# Patient Record
Sex: Male | Born: 1960 | Race: Black or African American | Hispanic: No | Marital: Married | Smoking: Never smoker
Health system: Southern US, Community
[De-identification: ages and names within clinical notes are randomized; demographics above are authoritative.]

## PROBLEM LIST (undated history)

## (undated) DIAGNOSIS — E7800 Pure hypercholesterolemia, unspecified: Secondary | ICD-10-CM

## (undated) DIAGNOSIS — I10 Essential (primary) hypertension: Secondary | ICD-10-CM

## (undated) HISTORY — PX: EYE SURGERY: SHX253

## (undated) HISTORY — PX: COLON SURGERY: SHX602

---

## 2008-11-08 ENCOUNTER — Emergency Department: Payer: Self-pay | Admitting: Emergency Medicine

## 2008-11-26 ENCOUNTER — Ambulatory Visit: Payer: Self-pay

## 2009-09-03 ENCOUNTER — Emergency Department: Payer: Self-pay | Admitting: Emergency Medicine

## 2009-09-14 ENCOUNTER — Ambulatory Visit: Payer: Self-pay | Admitting: Internal Medicine

## 2010-06-28 ENCOUNTER — Ambulatory Visit: Payer: Self-pay

## 2010-09-11 ENCOUNTER — Ambulatory Visit: Payer: Self-pay | Admitting: Adult Health

## 2010-09-22 DIAGNOSIS — M109 Gout, unspecified: Secondary | ICD-10-CM | POA: Insufficient documentation

## 2010-12-06 DIAGNOSIS — M1710 Unilateral primary osteoarthritis, unspecified knee: Secondary | ICD-10-CM | POA: Insufficient documentation

## 2010-12-08 ENCOUNTER — Ambulatory Visit: Payer: Self-pay | Admitting: Adult Health

## 2010-12-27 ENCOUNTER — Encounter: Payer: Self-pay | Admitting: "Endocrinology

## 2011-01-12 ENCOUNTER — Encounter: Payer: Self-pay | Admitting: "Endocrinology

## 2011-02-26 DIAGNOSIS — K219 Gastro-esophageal reflux disease without esophagitis: Secondary | ICD-10-CM | POA: Insufficient documentation

## 2011-04-25 DIAGNOSIS — K449 Diaphragmatic hernia without obstruction or gangrene: Secondary | ICD-10-CM | POA: Insufficient documentation

## 2011-06-28 DIAGNOSIS — R1314 Dysphagia, pharyngoesophageal phase: Secondary | ICD-10-CM | POA: Insufficient documentation

## 2012-03-17 ENCOUNTER — Ambulatory Visit: Payer: Self-pay

## 2012-03-17 ENCOUNTER — Ambulatory Visit: Payer: Self-pay | Admitting: Adult Health

## 2012-06-18 DIAGNOSIS — H2510 Age-related nuclear cataract, unspecified eye: Secondary | ICD-10-CM | POA: Insufficient documentation

## 2012-06-18 DIAGNOSIS — H409 Unspecified glaucoma: Secondary | ICD-10-CM | POA: Insufficient documentation

## 2012-09-01 ENCOUNTER — Ambulatory Visit: Payer: Self-pay

## 2013-01-15 ENCOUNTER — Emergency Department: Payer: Self-pay | Admitting: Emergency Medicine

## 2013-01-30 ENCOUNTER — Ambulatory Visit: Payer: Self-pay

## 2013-07-24 DIAGNOSIS — A530 Latent syphilis, unspecified as early or late: Secondary | ICD-10-CM | POA: Insufficient documentation

## 2013-08-06 IMAGING — CR DG CHEST 2V
1 series · 2 of 2 positions shown · non-contrast
Comparison: none

REASON FOR EXAM: FU on pneumonia
COMMENTS:

[Series 1: pa · 0.17mm/px · 2 of 2 slices shown]
[im 1/2]
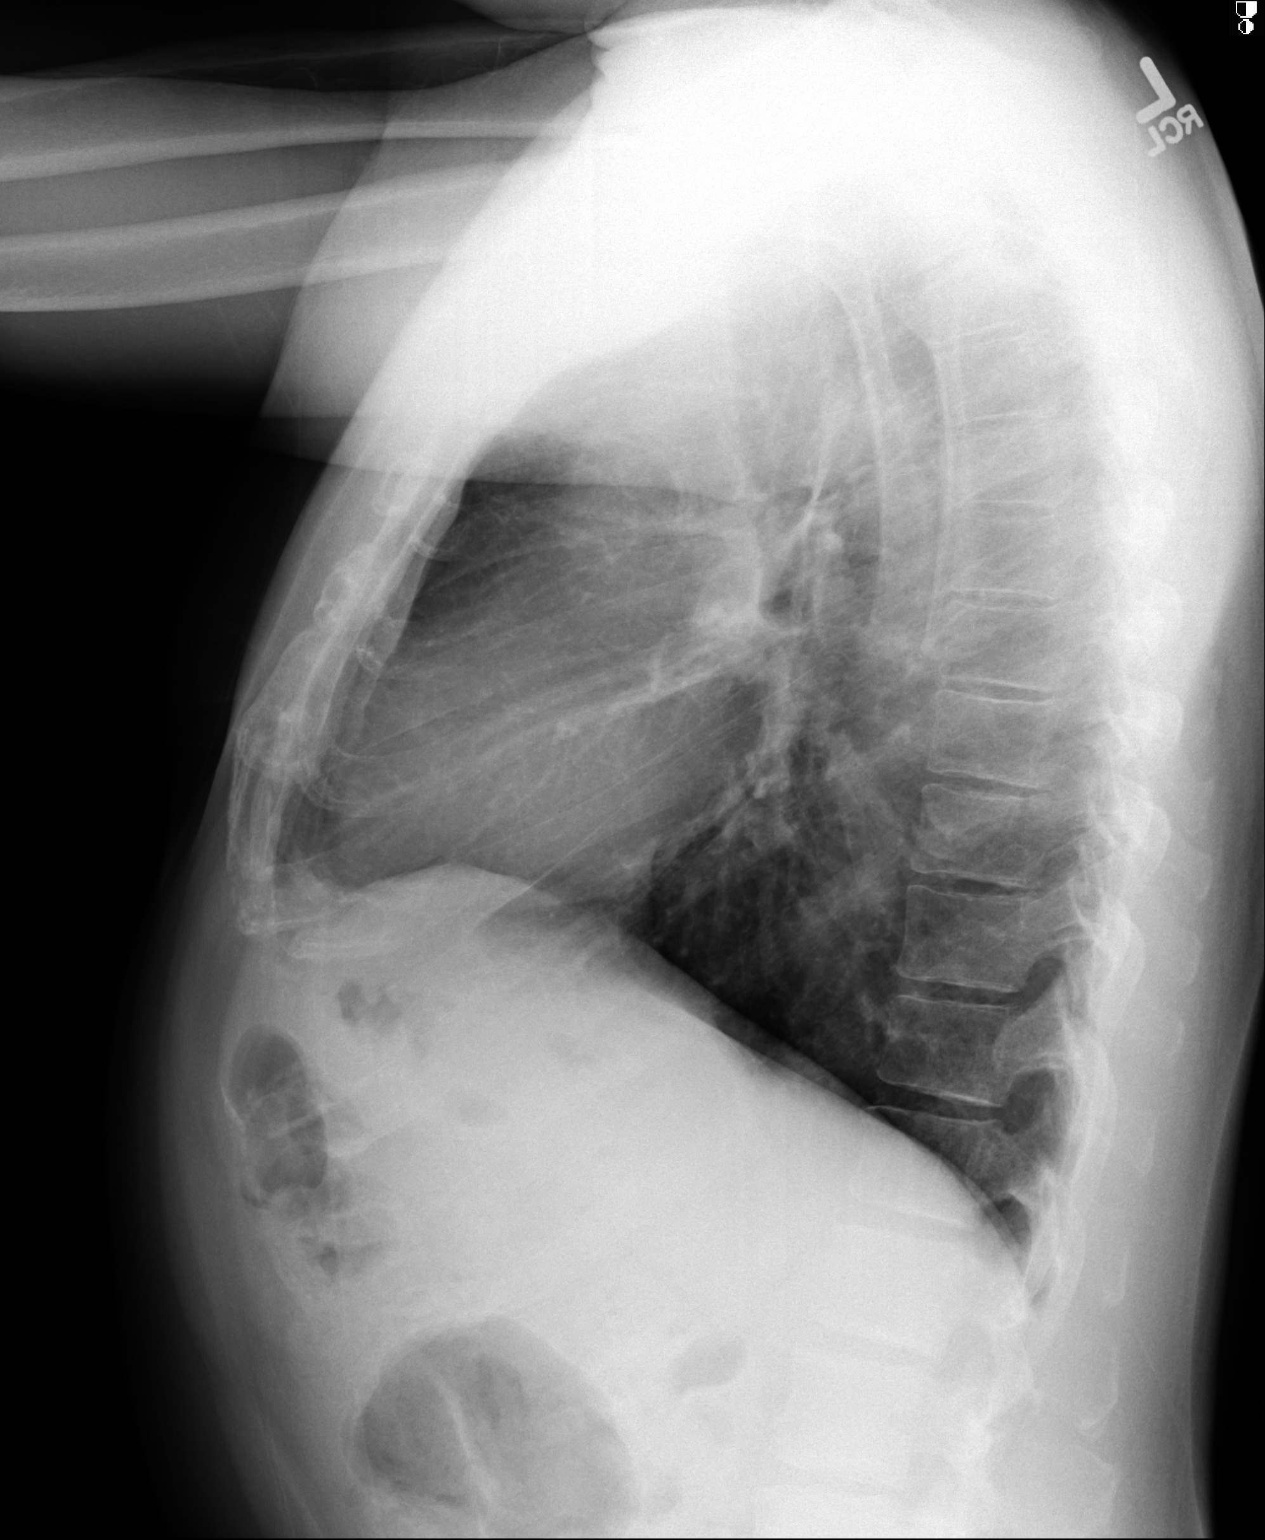
[im 2/2]
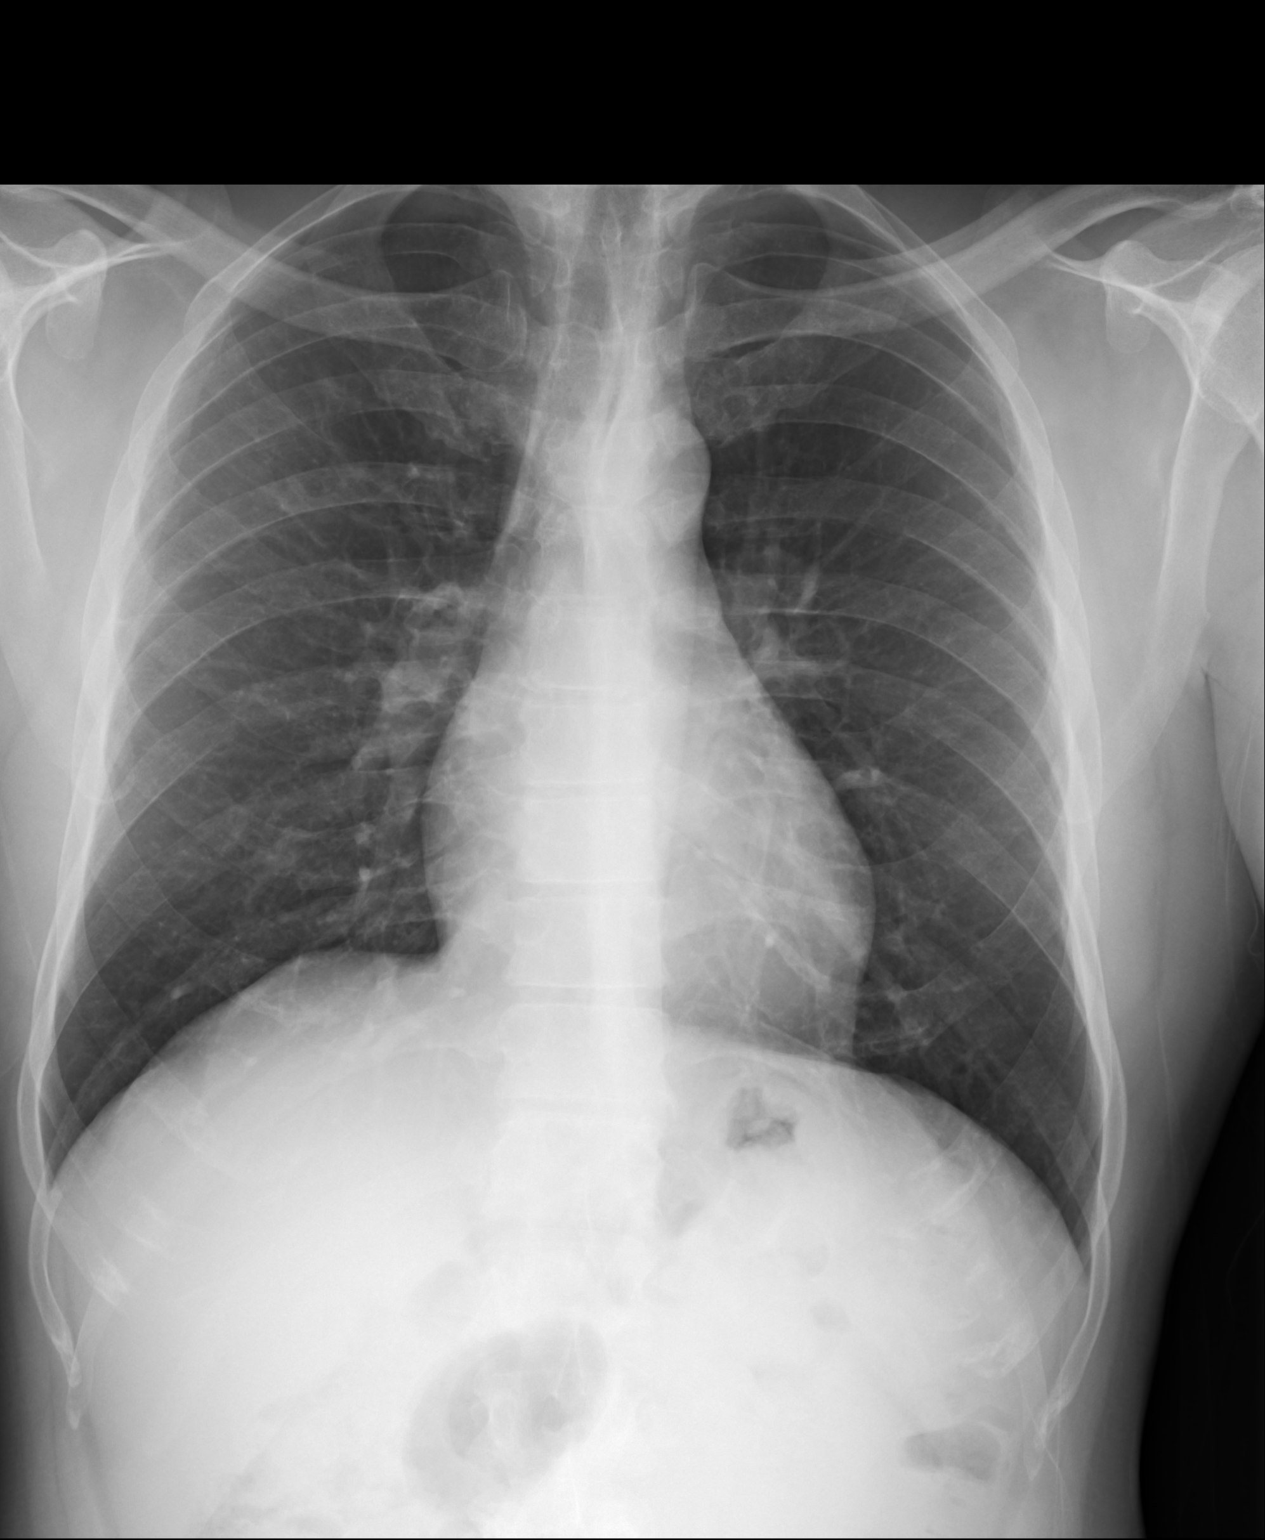

[2 of 2 positions shown; findings below may reference images not displayed]

PROCEDURE:     DXR - DXR CHEST PA (OR AP) AND LATERAL  - January 30, 2013  [DATE]

RESULT:     Comparison is made to the previous study of 01/15/2013.

There is improving aeration in the right upper lobe. The lungs are otherwise
clear. The heart and pulmonary vessels are normal. The bony and mediastinal
structures are unremarkable.
IMPRESSION: Findings consistent with near-complete resolution of
previous right upper lobe pneumonia.

[REDACTED]

## 2014-02-04 ENCOUNTER — Emergency Department: Payer: Self-pay | Admitting: Emergency Medicine

## 2016-02-09 DIAGNOSIS — Z8673 Personal history of transient ischemic attack (TIA), and cerebral infarction without residual deficits: Secondary | ICD-10-CM | POA: Insufficient documentation

## 2016-06-07 ENCOUNTER — Emergency Department
Admission: EM | Admit: 2016-06-07 | Discharge: 2016-06-07 | Disposition: A | Discharge status: Home / Self Care | Payer: Medicare HMO | Attending: Emergency Medicine | Admitting: Emergency Medicine

## 2016-06-07 ENCOUNTER — Encounter: Payer: Self-pay | Admitting: Emergency Medicine

## 2016-06-07 DIAGNOSIS — Y9389 Activity, other specified: Secondary | ICD-10-CM | POA: Diagnosis not present

## 2016-06-07 DIAGNOSIS — Y929 Unspecified place or not applicable: Secondary | ICD-10-CM | POA: Insufficient documentation

## 2016-06-07 DIAGNOSIS — Y999 Unspecified external cause status: Secondary | ICD-10-CM | POA: Insufficient documentation

## 2016-06-07 DIAGNOSIS — M546 Pain in thoracic spine: Secondary | ICD-10-CM | POA: Insufficient documentation

## 2016-06-07 DIAGNOSIS — M62830 Muscle spasm of back: Secondary | ICD-10-CM | POA: Diagnosis not present

## 2016-06-07 MED ORDER — CYCLOBENZAPRINE HCL 10 MG PO TABS
5.0000 mg | ORAL_TABLET | Freq: Once | ORAL | Status: AC
  Administered 2016-06-07: 5 mg via ORAL
  Filled 2016-06-07: qty 2

## 2016-06-07 MED ORDER — KETOROLAC TROMETHAMINE 30 MG/ML IJ SOLN
60.0000 mg | Freq: Once | INTRAMUSCULAR | Status: AC
  Administered 2016-06-07: 60 mg via INTRAMUSCULAR
  Filled 2016-06-07: qty 2

## 2016-06-07 MED ORDER — IBUPROFEN 800 MG PO TABS: 800.0000 mg | ORAL_TABLET | Freq: Three times a day (TID) | ORAL | 0 refills | Status: DC | PRN

## 2016-06-07 MED ORDER — OXYCODONE-ACETAMINOPHEN 5-325 MG PO TABS
1.0000 | ORAL_TABLET | Freq: Once | ORAL | Status: AC
  Administered 2016-06-07: 1 via ORAL
  Filled 2016-06-07: qty 1

## 2016-06-07 MED ORDER — OXYCODONE-ACETAMINOPHEN 5-325 MG PO TABS: 1.0000 | ORAL_TABLET | ORAL | 0 refills | Status: AC | PRN

## 2016-06-07 MED ORDER — CYCLOBENZAPRINE HCL 5 MG PO TABS: ORAL_TABLET | ORAL | 0 refills | Status: DC
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
## 2016-06-07 NOTE — Discharge Instructions (Signed)
1. Take medicines as needed for pain and muscle spasms (Motrin/Percocet/Flexeril). 2. Apply moist heat to affected area several times daily. 3. Return to the ER for worsening symptoms, persistent vomiting, difficulty breathing or other concerns.

## 2016-09-03 DIAGNOSIS — R7303 Prediabetes: Secondary | ICD-10-CM | POA: Insufficient documentation

## 2016-11-05 ENCOUNTER — Emergency Department: Payer: Medicare HMO

## 2016-11-05 ENCOUNTER — Emergency Department
Admission: EM | Admit: 2016-11-05 | Discharge: 2016-11-05 | Disposition: A | Discharge status: Home / Self Care | Payer: Medicare HMO | Attending: Emergency Medicine | Admitting: Emergency Medicine

## 2016-11-05 DIAGNOSIS — R05 Cough: Secondary | ICD-10-CM | POA: Diagnosis present

## 2016-11-05 DIAGNOSIS — J189 Pneumonia, unspecified organism: Secondary | ICD-10-CM | POA: Insufficient documentation

## 2016-11-05 LAB — CBC
HCT: 39.9 % — ABNORMAL LOW (ref 40.0–52.0)
Hemoglobin: 13.8 g/dL (ref 13.0–18.0)
MCH: 30.1 pg (ref 26.0–34.0)
MCHC: 34.6 g/dL (ref 32.0–36.0)
MCV: 87.1 fL (ref 80.0–100.0)
PLATELETS: 249 10*3/uL (ref 150–440)
RBC: 4.59 MIL/uL (ref 4.40–5.90)
RDW: 14.3 % (ref 11.5–14.5)
WBC: 6.6 10*3/uL (ref 3.8–10.6)

## 2016-11-05 LAB — BASIC METABOLIC PANEL
ANION GAP: 8 (ref 5–15)
BUN: 6 mg/dL (ref 6–20)
CALCIUM: 9.1 mg/dL (ref 8.9–10.3)
CO2: 28 mmol/L (ref 22–32)
CREATININE: 1.22 mg/dL (ref 0.61–1.24)
Chloride: 104 mmol/L (ref 101–111)
GLUCOSE: 108 mg/dL — AB (ref 65–99)
Potassium: 3.9 mmol/L (ref 3.5–5.1)
Sodium: 140 mmol/L (ref 135–145)

## 2016-11-05 LAB — TROPONIN I: Troponin I: <0.03 ng/mL (ref <0.03)

## 2016-11-05 MED ORDER — ACETAMINOPHEN 500 MG PO TABS
ORAL_TABLET | ORAL | Status: AC
  Administered 2016-11-05: 1000 mg via ORAL
  Filled 2016-11-05: qty 2

## 2016-11-05 MED ORDER — AZITHROMYCIN 250 MG PO TABS
500.0000 mg | ORAL_TABLET | Freq: Once | ORAL | Status: AC
  Administered 2016-11-05: 500 mg via ORAL
  Filled 2016-11-05: qty 2

## 2016-11-05 MED ORDER — ACETAMINOPHEN 500 MG PO TABS
1000.0000 mg | ORAL_TABLET | Freq: Once | ORAL | Status: AC
  Administered 2016-11-05: 1000 mg via ORAL

## 2016-11-05 MED ORDER — AZITHROMYCIN 250 MG PO TABS: ORAL_TABLET | ORAL | 0 refills | Status: AC

## 2016-11-05 NOTE — ED Triage Notes (Signed)
Pt reports had GI bug last week but that has resolved. Pt reports has had a non-productive cough for four days. Pt reports coughing so much that his throat and chest are burning. Pt reports fever at home up to 104.

## 2016-11-05 NOTE — ED Provider Notes (Signed)
[facility] Emergency Department Provider Note  ____________________________________________   First MD Initiated Contact with Patient 11/05/16 2034     (approximate)  I have reviewed the triage vital signs and the nursing notes.   HISTORY  Chief Complaint Cough   HPI Andrew Bolton is a 56 y.o. male who is presenting with a nonproductive cough over the past 4 days. He says that he is coughing so much now that his chest in his throat or "burning." He reports fever at home up to 104. Denies any nausea vomiting or diarrhea. Says that his chest feels mildly sore at this time and the pain is localized to the center of the sternum. There is no radiation of the pain. Patient denies any shortness of breath. Pain is mild at this time and worsens with coughing.   No past medical history on file.  There are no active problems to display for this patient.   Past Surgical History:  Procedure Laterality Date  . COLON SURGERY    . EYE SURGERY      Prior to Admission medications   Medication Sig Start Date End Date Taking? Authorizing Provider  cyclobenzaprine (FLEXERIL) 5 MG tablet 1 tablet every 8 hours as needed for muscle spasms 06/07/16   Irean Hong, MD  ibuprofen (ADVIL,MOTRIN) 800 MG tablet Take 1 tablet (800 mg total) by mouth every 8 (eight) hours as needed for moderate pain. 06/07/16   Irean Hong, MD  oxyCODONE-acetaminophen (ROXICET) 5-325 MG tablet Take 1 tablet by mouth every 4 (four) hours as needed for severe pain. 06/07/16   Irean Hong, MD    Allergies Patient has no known allergies.  No family history on file.  Social History Social History  Substance Use Topics  . Smoking status: Never Smoker  . Smokeless tobacco: Never Used  . Alcohol use No    Review of Systems Constitutional: as above Eyes: No visual changes. ENT: No sore throat. Cardiovascular: as above Respiratory: as above Gastrointestinal: No abdominal pain.  No nausea,  no vomiting.  No diarrhea.  No constipation. Genitourinary: Negative for dysuria. Musculoskeletal: Negative for back pain. Skin: Negative for rash. Neurological: Negative for headaches, focal weakness or numbness.  10-point ROS otherwise negative.  ____________________________________________   PHYSICAL EXAM:  VITAL SIGNS: ED Triage Vitals  Enc Vitals Group     BP 11/05/16 1722 131/88     Pulse Rate 11/05/16 1722 85     Resp 11/05/16 1722 20     Temp 11/05/16 1722 99 F (37.2 C)     Temp Source 11/05/16 1722 Oral     SpO2 11/05/16 1722 99 %     Weight 11/05/16 1723 158 lb (71.7 kg)     Height 11/05/16 1723 5\' 6"  (1.676 m)     Head Circumference --      Peak Flow --      Pain Score 11/05/16 1739 9     Pain Loc --      Pain Edu? --      Excl. in GC? --     Constitutional: Alert and oriented. Well appearing and in no acute distress. Eyes: Conjunctivae are normal. PERRL. EOMI. Head: Atraumatic. Nose: No congestion/rhinnorhea. Mouth/Throat: Mucous membranes are moist.  Oropharynx non-erythematous. Neck: No stridor.   Cardiovascular: Normal rate, regular rhythm. Grossly normal heart sounds.   Respiratory: Normal respiratory effort.  No retractions. Lungs CTAB. Gastrointestinal: Soft and nontender. No distention.  Musculoskeletal: No lower extremity tenderness nor  edema.  No joint effusions. Neurologic:  Normal speech and language. No gross focal neurologic deficits are appreciated. No gait instability. Skin:  Skin is warm, dry and intact. No rash noted. Psychiatric: Mood and affect are normal. Speech and behavior are normal.  ____________________________________________   LABS (all labs ordered are listed, but only abnormal results are displayed)  Labs Reviewed  CBC - Abnormal; Notable for the following:       Result Value   HCT 39.9 (*)    All other components within normal limits  BASIC METABOLIC PANEL - Abnormal; Notable for the following:    Glucose, Bld 108  (*)    All other components within normal limits  TROPONIN I   ____________________________________________  EKG  ED ECG REPORT I, Arelia LongestSchaevitz,  David M, the attending physician, personally viewed and interpreted this ECG.   Date: 11/05/2016  EKG Time: 1745  Rate: 74  Rhythm: normal sinus rhythm  Axis: Normal  Intervals:none  ST&T Change: No ST segment elevation or depression. No abnormal T-wave inversion.  ____________________________________________  RADIOLOGY  CT Chest Wo Contrast (Final result)  Result time 11/05/16 21:36:10  Final result by Myles RosenthalJohn Stahl, MD (11/05/16 21:36:10)           Narrative:   CLINICAL DATA: Nonproductive cough and chest pain for 4 days. Left lung nodular opacity on chest radiograph.  EXAM: CT CHEST WITHOUT CONTRAST  TECHNIQUE: Multidetector CT imaging of the chest was performed following the standard protocol without IV contrast.  COMPARISON: Chest radiograph on 11/05/2016  FINDINGS: Cardiovascular: No acute findings.  Mediastinum/Nodes: No masses or pathologically enlarged lymph nodes identified on this unenhanced exam.  Lungs/Pleura: Ill-defined airspace opacity is seen within the lingula, which corresponds with the nodular misty seen on recent chest radiograph. This is most consistent with pneumonia. No new discrete pulmonary nodule or mass identified. No evidence pleural effusion. Central tracheobronchial airways are patent.  Upper Abdomen: Unremarkable.  Musculoskeletal: No suspicious bone lesions.  IMPRESSION: Ill-defined airspace opacity in the lingula, most consistent with pneumonia. No evidence pulmonary mass or lymphadenopathy. Followup PA and lateral chest X-ray is recommended in 3-4 weeks following trial of antibiotic therapy to ensure resolution.   Electronically Signed By: Myles RosenthalJohn Stahl M.D. On: 11/05/2016 21:36            DG Chest 2 View (Final result)  Result time 11/05/16 18:25:42  Final  result by Myles RosenthalJohn Stahl, MD (11/05/16 18:25:42)           Narrative:   CLINICAL DATA: Nonproductive cough, chest pain, and fever 4 days.  EXAM: CHEST 2 VIEW  COMPARISON: 01/30/2013  FINDINGS: The heart size and mediastinal contours are within normal limits. No evidence of pulmonary consolidation or pleural effusion. A focal ill-defined nodular opacity is seen in the left midlung which is new since previous study.  IMPRESSION: Focal ill-defined nodular opacity in left midlung. Recommend chest CT without contrast to exclude pulmonary neoplasm.   Electronically Signed By: Myles RosenthalJohn Stahl M.D. On: 11/05/2016 18:25            ____________________________________________   PROCEDURES  Procedure(s) performed:   Procedures  Critical Care performed:   ____________________________________________   INITIAL IMPRESSION / ASSESSMENT AND PLAN / ED COURSE  Pertinent labs & imaging results that were available during my care of the patient were reviewed by me and considered in my medical decision making (see chart for details).  ----------------------------------------- 9:50 PM on 11/05/2016 -----------------------------------------  Patient resting comfortably at this time. No mass found  on the CAT scan as originally thought on the chest x-ray. Likely lingular pneumonia. Patient to be given a Z-Pak. Will follow-up with his primary care doctor in [facility] for repeat chest x-ray and 3-4 weeks. We discussed the diagnosis as well as the plan and he as well as his daughter understanding of and willing to comply.      ____________________________________________   FINAL CLINICAL IMPRESSION(S) / ED DIAGNOSES   Community acquired pneumonia.   NEW MEDICATIONS STARTED DURING THIS VISIT:  New Prescriptions   No medications on file     Note:  This document was prepared using Dragon voice recognition software and may include unintentional dictation errors.      Myrna Blazer, MD 11/05/16 2151

## 2016-11-05 NOTE — ED Notes (Signed)
Gave pt ice chips 

## 2017-01-02 DIAGNOSIS — H402233 Chronic angle-closure glaucoma, bilateral, severe stage: Secondary | ICD-10-CM | POA: Insufficient documentation

## 2017-05-12 IMAGING — CR DG CHEST 2V
1 series · 2 of 2 positions shown · non-contrast
Comparison: 01/30/2013

CLINICAL DATA: Nonproductive cough, chest pain, and fever 4 days.

EXAM:
CHEST  2 VIEW

[Series 1: dg chest 2 view · 0.14mm/px · 2 of 2 slices shown]
[im 1/2]
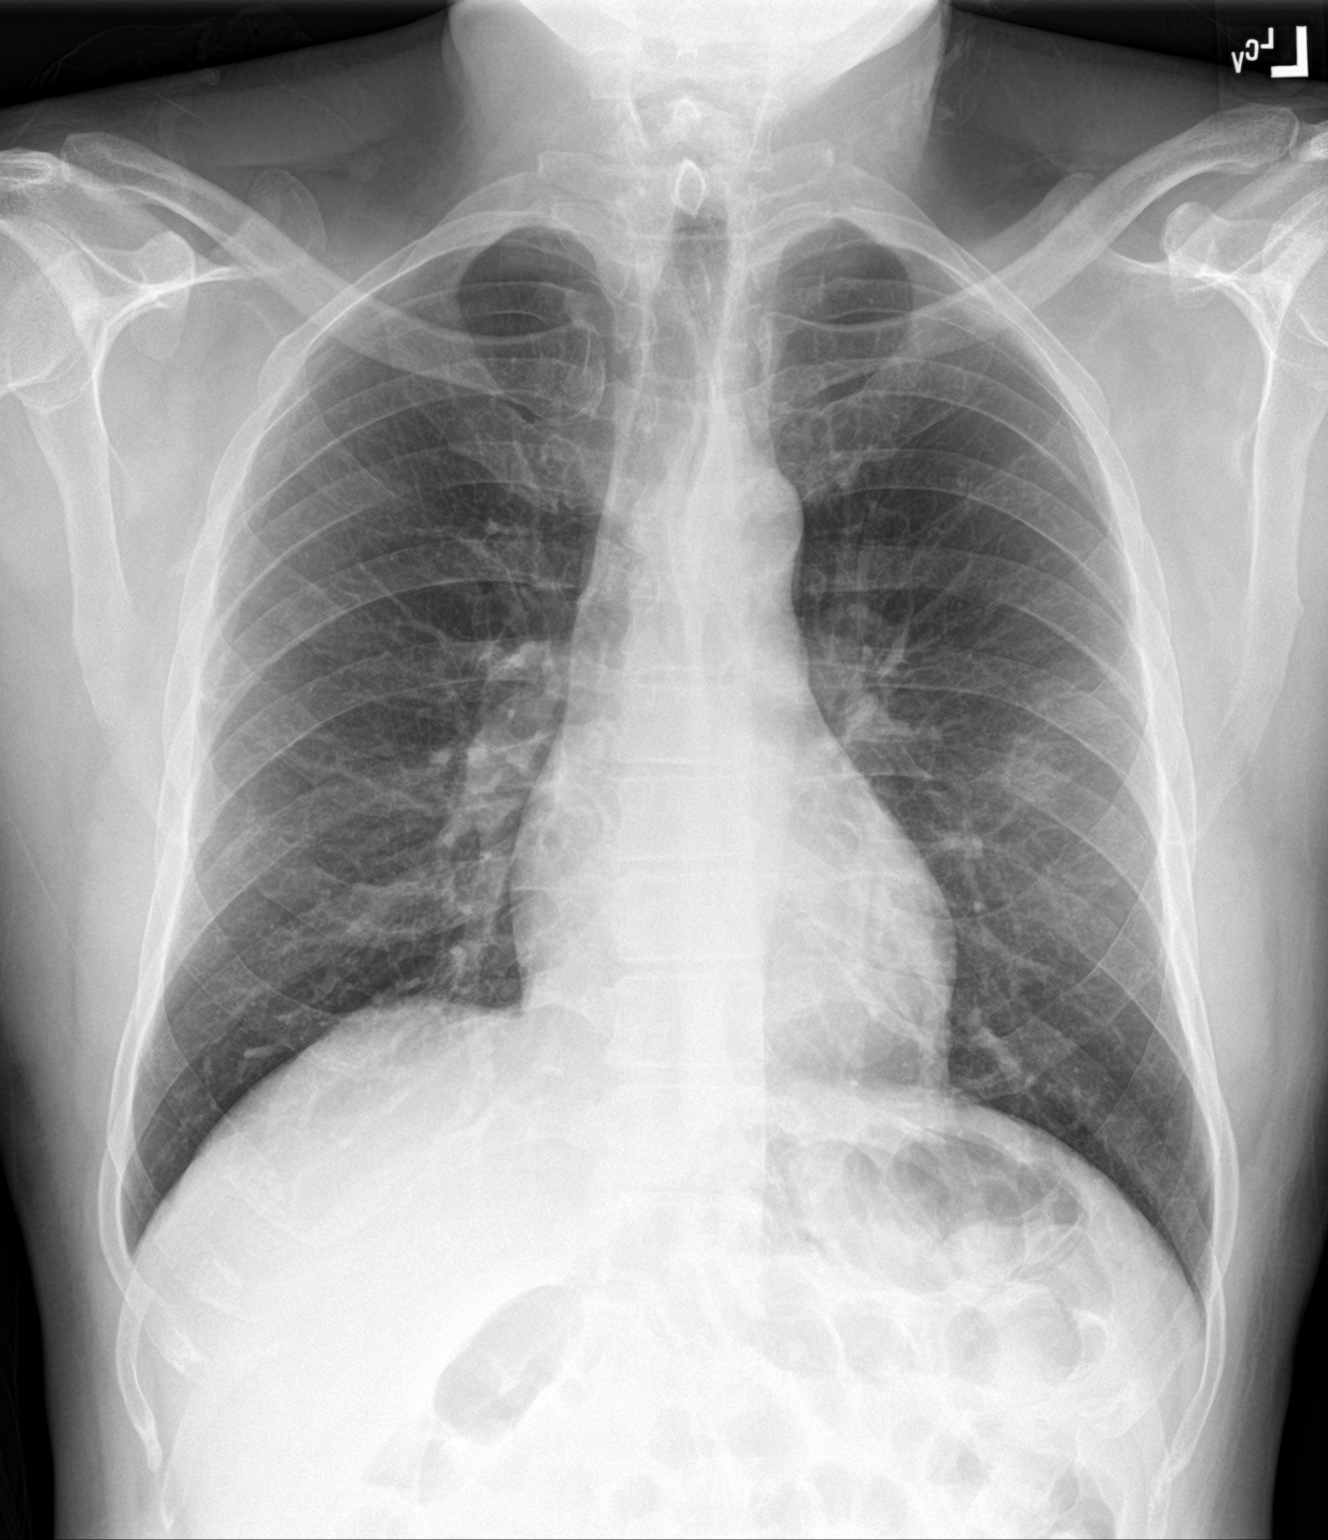
[im 2/2]
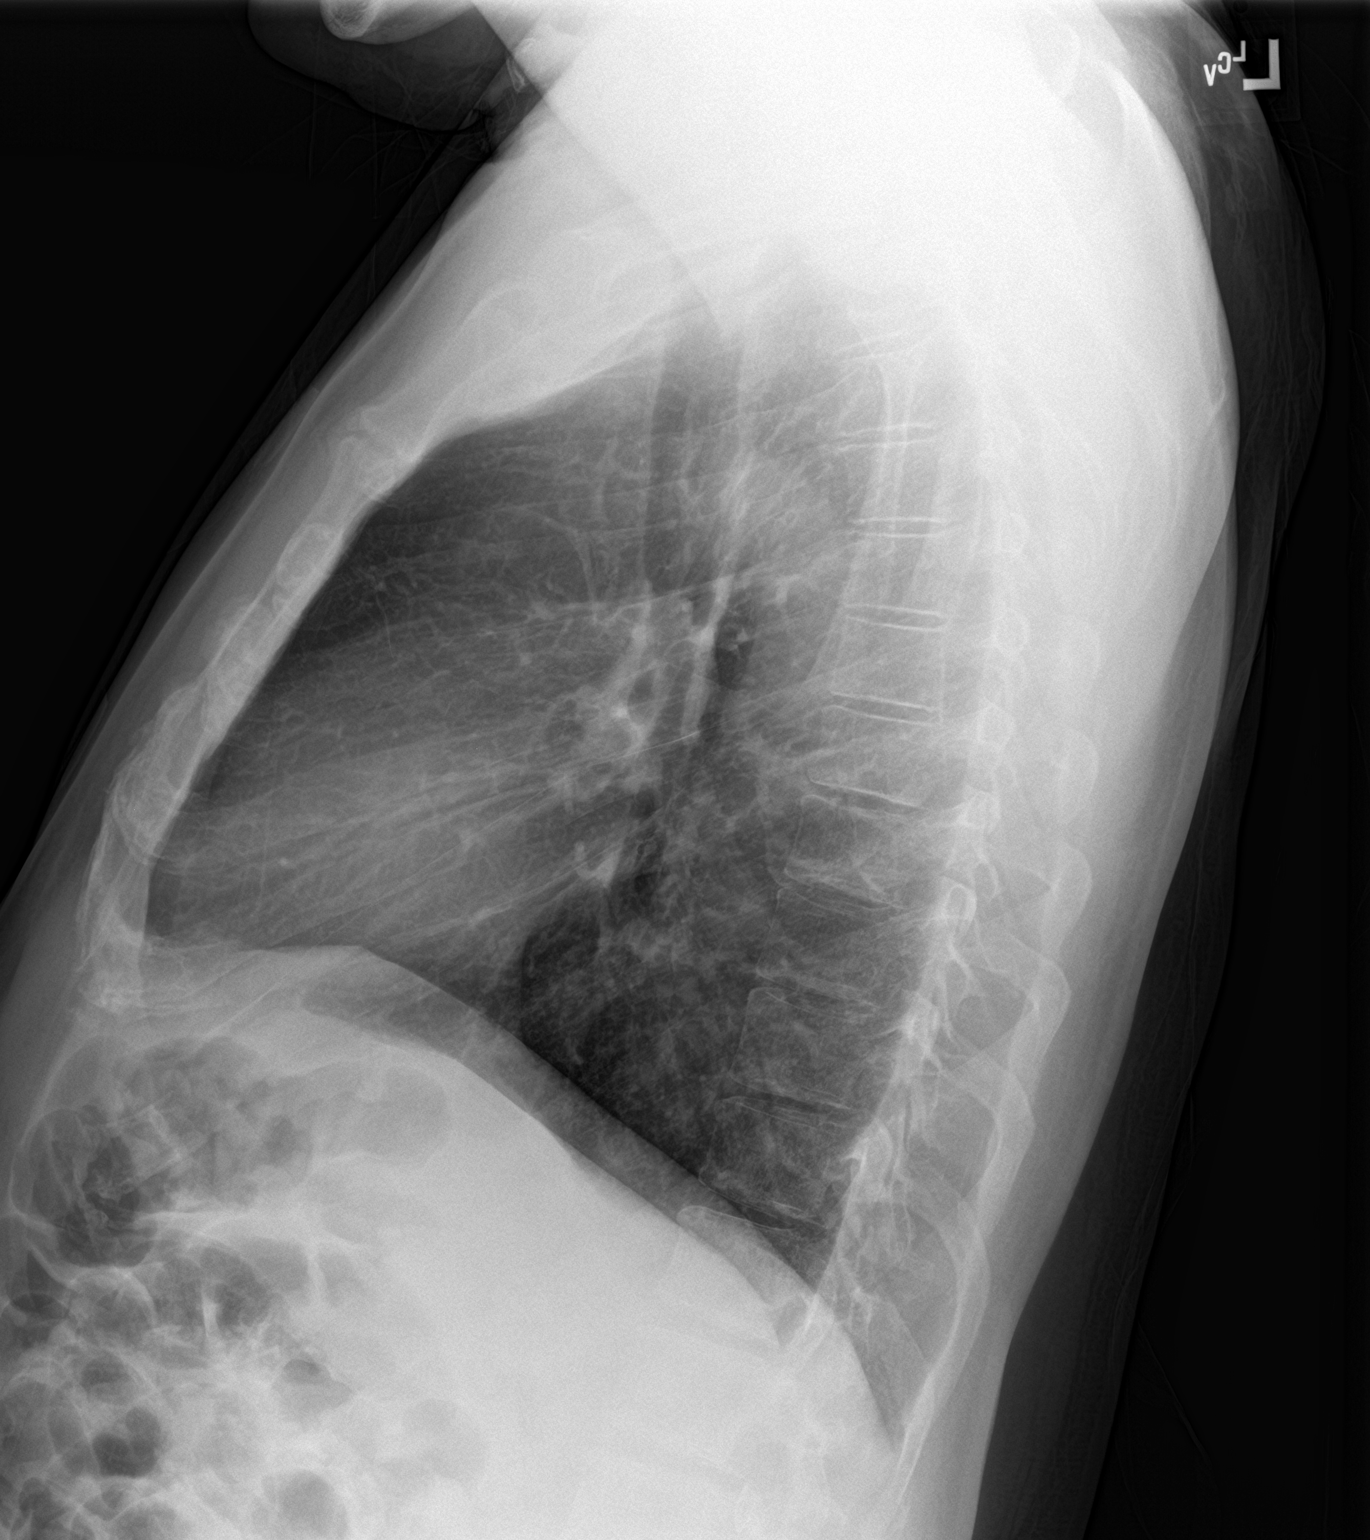

[2 of 2 positions shown; findings below may reference images not displayed]

FINDINGS: The heart size and mediastinal contours are within normal limits. No
evidence of pulmonary consolidation or pleural effusion. A focal
ill-defined nodular opacity is seen in the left midlung which is new
since previous study.
IMPRESSION: Focal ill-defined nodular opacity in left midlung. Recommend chest
CT without contrast to exclude pulmonary neoplasm.

## 2018-05-13 DIAGNOSIS — R222 Localized swelling, mass and lump, trunk: Secondary | ICD-10-CM | POA: Insufficient documentation

## 2018-05-13 DIAGNOSIS — E785 Hyperlipidemia, unspecified: Secondary | ICD-10-CM | POA: Insufficient documentation

## 2018-07-11 DIAGNOSIS — K760 Fatty (change of) liver, not elsewhere classified: Secondary | ICD-10-CM | POA: Insufficient documentation

## 2018-09-19 DIAGNOSIS — Z0000 Encounter for general adult medical examination without abnormal findings: Secondary | ICD-10-CM | POA: Insufficient documentation

## 2019-01-15 DIAGNOSIS — G479 Sleep disorder, unspecified: Secondary | ICD-10-CM | POA: Insufficient documentation

## 2019-02-19 DIAGNOSIS — I10 Essential (primary) hypertension: Secondary | ICD-10-CM | POA: Insufficient documentation

## 2019-05-13 DIAGNOSIS — R634 Abnormal weight loss: Secondary | ICD-10-CM | POA: Insufficient documentation

## 2019-05-21 DIAGNOSIS — R058 Other specified cough: Secondary | ICD-10-CM | POA: Insufficient documentation

## 2019-11-11 ENCOUNTER — Ambulatory Visit: Payer: Medicare HMO | Attending: Internal Medicine

## 2019-11-11 DIAGNOSIS — Z23 Encounter for immunization: Secondary | ICD-10-CM

## 2019-11-11 NOTE — Progress Notes (Signed)
   Covid-19 Vaccination Clinic  Name:  YURIY CUI    MRN: [id number] DOB: August 28, 1960  11/11/2019  Mr. Ludvigsen was observed post Covid-19 immunization for 30 minutes based on pre-vaccination screening without incident. He was provided with Vaccine Information Sheet and instruction to access the V-Safe system.   Mr. Atteberry was instructed to call 911 with any severe reactions post vaccine: Marland Kitchen Difficulty breathing  . Swelling of face and throat  . A fast heartbeat  . A bad rash all over body  . Dizziness and weakness   Immunizations Administered    Name Date Dose VIS Date Route   Pfizer COVID-19 Vaccine 11/11/2019  9:10 AM 0.3 mL 07/24/2019 Intramuscular   Manufacturer: [employer], [employer]   Lot: [id number]   NDC: [address]-0

## 2019-12-06 ENCOUNTER — Ambulatory Visit: Payer: Medicare HMO | Attending: Internal Medicine

## 2019-12-06 DIAGNOSIS — Z23 Encounter for immunization: Secondary | ICD-10-CM

## 2019-12-06 NOTE — Progress Notes (Signed)
   Covid-19 Vaccination Clinic  Name:  Andrew Bolton    MRN: [id number] DOB: 25-Feb-1961  12/06/2019  [name] was observed post Covid-19 immunization for 15 minutes without incident. He was provided with Vaccine Information Sheet and instruction to access the V-Safe system.   [name] was instructed to call 911 with any severe reactions post vaccine: Marland Kitchen Difficulty breathing  . Swelling of face and throat  . A fast heartbeat  . A bad rash all over body  . Dizziness and weakness   Immunizations Administered    Name Date Dose VIS Date Route   Pfizer COVID-19 Vaccine 12/06/2019  8:56 AM 0.3 mL 10/07/2018 Intramuscular   Manufacturer: [employer], [employer]   Lot: [id number]   NDC: [address]-8

## 2021-07-25 DIAGNOSIS — Z87898 Personal history of other specified conditions: Secondary | ICD-10-CM | POA: Insufficient documentation

## 2021-07-25 DIAGNOSIS — K645 Perianal venous thrombosis: Secondary | ICD-10-CM | POA: Insufficient documentation

## 2021-08-21 DIAGNOSIS — J302 Other seasonal allergic rhinitis: Secondary | ICD-10-CM | POA: Insufficient documentation

## 2022-07-03 ENCOUNTER — Ambulatory Visit (INDEPENDENT_AMBULATORY_CARE_PROVIDER_SITE_OTHER): Payer: Medicare Other | Admitting: Podiatry

## 2022-07-03 DIAGNOSIS — M79674 Pain in right toe(s): Secondary | ICD-10-CM

## 2022-07-03 DIAGNOSIS — H469 Unspecified optic neuritis: Secondary | ICD-10-CM | POA: Insufficient documentation

## 2022-07-03 DIAGNOSIS — H348192 Central retinal vein occlusion, unspecified eye, stable: Secondary | ICD-10-CM | POA: Insufficient documentation

## 2022-07-03 DIAGNOSIS — B351 Tinea unguium: Secondary | ICD-10-CM | POA: Diagnosis not present

## 2022-07-03 DIAGNOSIS — M79675 Pain in left toe(s): Secondary | ICD-10-CM

## 2022-07-03 NOTE — Progress Notes (Signed)
  Subjective:  Patient ID: Andrew Bolton, male    DOB: Nov 20, 1960,  MRN: [id number]  Chief Complaint  Patient presents with   Nail Problem    Nail trim    61 y.o. male returns for the above complaint.  Patient presents with thickened elongated dystrophic toenails x10 mild pain on palpation hurts with ambulation hurts with pressure.  He is not able to be down himself.  He would like for me to debride down.  He is a diabetic with unknown A1c  Objective:  There were no vitals filed for this visit. Podiatric Exam: Vascular: dorsalis pedis and posterior tibial pulses are palpable bilateral. Capillary return is immediate. Temperature gradient is WNL. Skin turgor WNL  Sensorium: Normal Semmes Weinstein monofilament test. Normal tactile sensation bilaterally. Nail Exam: Pt has thick disfigured discolored nails with subungual debris noted bilateral entire nail hallux through fifth toenails.  Pain on palpation to the nails. Ulcer Exam: There is no evidence of ulcer or pre-ulcerative changes or infection. Orthopedic Exam: Muscle tone and strength are WNL. No limitations in general ROM. No crepitus or effusions noted.  Skin: No Porokeratosis. No infection or ulcers    Assessment & Plan:   1. Pain due to onychomycosis of toenails of both feet     Patient was evaluated and treated and all questions answered.  Onychomycosis with pain  -Nails palliatively debrided as below. -Educated on self-care  Procedure: Nail Debridement Rationale: pain  Type of Debridement: manual, sharp debridement. Instrumentation: Nail nipper, rotary burr. Number of Nails: 10  Procedures and Treatment: Consent by patient was obtained for treatment procedures. The patient understood the discussion of treatment and procedures well. All questions were answered thoroughly reviewed. Debridement of mycotic and hypertrophic toenails, 1 through 5 bilateral and clearing of subungual debris. No ulceration, no infection noted.   Return Visit-Office Procedure: Patient instructed to return to the office for a follow up visit 3 months for continued evaluation and treatment.  Nicholes Rough, DPM    No follow-ups on file.

## 2022-10-04 ENCOUNTER — Ambulatory Visit: Payer: 59 | Admitting: Podiatry

## 2023-06-12 ENCOUNTER — Ambulatory Visit
Admission: EM | Admit: 2023-06-12 | Discharge: 2023-06-12 | Disposition: A | Discharge status: Home / Self Care | Payer: Medicare HMO | Attending: Family Medicine | Admitting: Family Medicine

## 2023-06-12 ENCOUNTER — Ambulatory Visit (INDEPENDENT_AMBULATORY_CARE_PROVIDER_SITE_OTHER): Payer: Medicare HMO

## 2023-06-12 DIAGNOSIS — M51369 Other intervertebral disc degeneration, lumbar region without mention of lumbar back pain or lower extremity pain: Secondary | ICD-10-CM

## 2023-06-12 DIAGNOSIS — M5441 Lumbago with sciatica, right side: Secondary | ICD-10-CM | POA: Diagnosis not present

## 2023-06-12 HISTORY — DX: Pure hypercholesterolemia, unspecified: E78.00

## 2023-06-12 HISTORY — DX: Essential (primary) hypertension: I10

## 2023-06-12 MED ORDER — CYCLOBENZAPRINE HCL 5 MG PO TABS: 5.0000 mg | ORAL_TABLET | Freq: Three times a day (TID) | ORAL | 0 refills | Status: AC | PRN

## 2023-06-12 MED ORDER — KETOROLAC TROMETHAMINE 60 MG/2ML IM SOLN
30.0000 mg | Freq: Once | INTRAMUSCULAR | Status: AC
  Administered 2023-06-12: 30 mg via INTRAMUSCULAR

## 2023-06-12 MED ORDER — CELECOXIB 200 MG PO CAPS: 200.0000 mg | ORAL_CAPSULE | Freq: Two times a day (BID) | ORAL | 0 refills | Status: AC

## 2023-06-12 NOTE — ED Provider Notes (Signed)
[facility]    CSN: [id number] Arrival date & time: 06/12/23  0813      History   Chief Complaint Chief Complaint  Patient presents with   Back Pain    HPI  HPI Andrew Bolton is a 62 y.o. male.   Andrew Bolton presents for right low back pain that started yesterday while trying to stand up after bending over.  States he had a sharp pain that radiates down his leg.  He had back pain before but nothing like this.  He had to use his cane again yesterday.  Needed the wheelchair to get into the building today.  Has been using Voltaren gel and Tylenol but this is not helping.  Denies use of Celebrex.  Nothing for pain this morning. Denies numbness, tingling, abdominal pain, chest pain, incontinence, perianal numbness. Continues to have pain with movement.    Past Medical History:  Diagnosis Date   High cholesterol    Hypertension     Patient Active Problem List   Diagnosis Date Noted   CRVO (central retinal vein occlusion) 07/03/2022   Optic neuropathy 07/03/2022   Seasonal allergies 08/21/2021   Hx of chest pain 07/25/2021   Thrombosed external hemorrhoid 07/25/2021   Recurrent dry cough 05/21/2019   Weight loss 05/13/2019   Hypertension 02/19/2019   Sleeping difficulty 01/15/2019   Healthcare maintenance 09/19/2018   Fatty liver 07/11/2018   Chest mass 05/13/2018   Hyperlipidemia 05/13/2018   Chronic angle-closure glaucoma of both eyes, severe stage 01/02/2017   Pre-diabetes 09/03/2016   History of TIA (transient ischemic attack) 02/09/2016   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 Authorizing Provider  aspirin 81 MG chewable tablet Chew by mouth. 11/07/21  Yes [provider]  atorvastatin (LIPITOR) 40 MG tablet SMARTSIG:1 Tablet(s) By Mouth Every Evening   Yes [provider]  bisacodyl (DULCOLAX) 5 MG EC tablet Take by mouth. 05/03/21  Yes [provider]  colchicine 0.6 MG tablet TAKE ONE TABLET (0.6 MG) BY MOUTH DAILY AT 9AM 06/27/22  Yes [provider]  lisinopril (ZESTRIL) 5 MG tablet Take 5 mg by mouth every morning.   Yes [provider]  losartan (COZAAR) 25 MG tablet Take 1 tablet by mouth daily. 05/07/23 05/06/24 Yes [provider]  tamsulosin (FLOMAX) 0.4 MG CAPS capsule Take 1 capsule by mouth daily. 11/07/21  Yes [provider]  allopurinol (ZYLOPRIM) 300 MG tablet Take 300 mg by mouth every morning.    [provider]  ALPHAGAN P 0.1 % SOLN Place 1 drop into the left eye 2 (two) times daily.    [provider]  celecoxib (CELEBREX) 200 MG capsule Take 1 capsule (200 mg total) by mouth 2 (two) times daily. 06/12/23 07/12/23  Katha Cabal, DO  cetirizine (ZYRTEC) 10 MG tablet Take 1 tablet by mouth daily. 11/07/21 11/07/22  [provider]  cyclobenzaprine (FLEXERIL) 5 MG tablet Take 1 tablet (5 mg total) by mouth 3 (three) times daily as needed  for muscle spasms. 1 tablet every 8 hours as needed for muscle spasms 06/12/23   Katha Cabal, DO  diclofenac Sodium (VOLTAREN) 1 % GEL Apply topically. 11/07/21   [provider]  dorzolamide-timolol (COSOPT) 2-0.5 % ophthalmic solution Administer 1 drop to both eyes Two (2) times a day. 03/13/22   [provider]  famotidine (PEPCID) 20 MG tablet Take by mouth. 11/07/21 11/07/22  [provider]  fluticasone (FLONASE) 50 MCG/ACT nasal spray Place 1 spray into both nostrils daily.    [provider]  ketorolac (ACULAR)  0.5 % ophthalmic solution Place 1 drop into the right eye 3 (three) times daily.    [provider]  latanoprost (XALATAN) 0.005 % ophthalmic solution Place 1 drop into the right eye at bedtime. 03/13/22   [provider]  nitroGLYCERIN (NITROSTAT) 0.4 MG SL tablet SMARTSIG:1 Tablet(s) Sublingual PRN    [provider]  oxyCODONE-acetaminophen (ROXICET) 5-325 MG tablet Take 1 tablet by mouth every 4 (four) hours as needed for severe pain. 06/07/16   Irean Hong, MD  polyethylene glycol powder [facility]) 17 GM/SCOOP powder Take by mouth. 11/07/21   [provider]  Travoprost, BAK Free, (TRAVATAN) 0.004 % SOLN ophthalmic solution INSTILL ONE DROP IN [facility] EYE IN THE EVENING (BULK) 06/30/22   [provider]    Family History History reviewed. No pertinent family history.  Social History Social History   Tobacco Use   Smoking status: Never   Smokeless tobacco: Never  Vaping Use   Vaping status: Never Used  Substance Use Topics   Alcohol use: No     Allergies   Patient has no known allergies.   Review of Systems Review of Systems: egative unless otherwise stated in HPI.      Physical Exam Triage Vital Signs ED Triage Vitals  Encounter Vitals Group     BP 06/12/23 0833 (!) 127/93     Systolic BP Percentile --      Diastolic BP Percentile --      Pulse Rate 06/12/23 0833 73     Resp 06/12/23 0833 17     Temp 06/12/23 0833 98.3 F (36.8 C)     Temp Source 06/12/23 0833 Oral     SpO2 06/12/23 0833 100 %     Weight --      Height --      Head Circumference --      Peak Flow --      Pain Score 06/12/23 0831 10     Pain Loc --      Pain Education --      Exclude from Growth Chart --    No data found.  Updated Vital Signs BP (!) 127/93 (BP Location: Right Arm)   Pulse 73   Temp 98.3 F (36.8 C) (Oral)   Resp 17   SpO2 100%   Visual Acuity Right Eye Distance:   Left Eye Distance:   Bilateral Distance:    Right  Eye Near:   Left Eye Near:    Bilateral Near:     Physical Exam GEN: well appearing male in no acute distress  CVS: well perfused  RESP: speaking in full sentences without pause, no respiratory distress  MSK:  Lumbar spine: - Inspection: no gross deformity or asymmetry, swelling or ecchymosis. No skin changes  - Palpation: mid to lower lumbar TTP over the spinous processes, right paraspinal lumbar paraspinal muscle hypertonicity, no SI joint tenderness bilaterally - ROM: limited active ROM  of the lumbar spine in flexion and extension  - Strength: 5/5 strength of lower extremity in L4-S1 nerve root distributions b/l - Neuro: sensation intact in the L4-S1 nerve root distribution b/ - Special testing: Negative straight leg raise SKIN: warm, dry, no overly skin rash or erythema    UC Treatments / Results  Labs (all labs ordered are listed, but only abnormal results are displayed) Labs Reviewed - No data to display  EKG   Radiology DG Lumbar Spine Complete  Result Date: 06/12/2023 CLINICAL DATA:  Right-sided lower back pain for 1 day. EXAM: LUMBAR SPINE - COMPLETE 4+ VIEW COMPARISON:  None Available. FINDINGS: There is no evidence of lumbar spine fracture. Alignment is normal. Moderate degenerative disc disease is noted at L3-4 with anterior osteophyte formation. IMPRESSION: Moderate degenerative disc disease is noted at L3-4. No acute abnormality seen. Electronically Signed   By: Lupita Raider M.D.   On: 06/12/2023 10:34     Procedures Procedures (including critical care time)  Medications Ordered in UC Medications  ketorolac (TORADOL) injection 30 mg (30 mg Intramuscular Given 06/12/23 0942)    Initial Impression / Assessment and Plan / UC Course  I have reviewed the triage vital signs and the nursing notes.  Pertinent labs & imaging results that were available during my care of the patient were reviewed by me and considered in my medical decision making (see chart for  details).      Pt is a 62 y.o.  male with history of back pain who presents for acute right sided lower back pain after bending over yesterday.  Offered p.o. versus IM pain control and he is agreeable to IM pain control.  Given Toradol 30 mg IM.   On chart review, patient had a CT lumbar spine on 07/30/2022 that showed no CT evidence of acute fracture or listhesis of the lumbosacral spine.  Per PCP note from yesterday, patient seen for lower back pain and was advised  to use Voltaren gel, Celebrex and Tylenol 500 mg every 6 hours as needed.  Patient has not been taking Celebrex.  Obtain lumbar plain films.  X-rays personally interpreted by me were unremarkable for fracture, or significant malalignment.  Patient aware the radiologist has not read his xray and is comfortable with the preliminary read by me. Will review radiologist read when available and call patient if a change in plan is warranted.  Pt agreeable to this plan prior to discharge.    Patient to gradually return to normal activities, as tolerated and continue ordinary activities within the limits permitted by pain. Prescribed Celebrex and muscle relaxer  for pain relief.  Advised patient to avoid other NSAIDs while taking Celebrex. Tylenol and Lidocaine patches PRN for multimodal pain relief. Counseled patient on red flag symptoms and when to seek immediate care.  No red flags suggesting cauda equina syndrome or progressive major motor weakness. Patient to follow up with  an orthopedic provider if symptoms do not improve with conservative treatment.  Return and ED precautions given.    Discussed MDM, treatment plan and plan for follow-up with patient who agrees with plan.   Radiologist impression reviewed.  No change in plan warranted.  Final Clinical Impressions(s) / UC Diagnoses   Final diagnoses:  Degeneration of intervertebral disc of lumbar region, unspecified whether pain present  Acute right-sided low back pain with  right-sided sciatica     Discharge Instructions      On my review of your xray images, you  did not have any fractures or dislocated bones. The radiologist has not yet read your xray. If it is significantly abnormal or urgent, someone will contact you.  You should see your results in MyChart.  If medication was prescribed, stop by the pharmacy to pick up your prescriptions.  For your  pain, Take 1000 mg Tylenol up to 3 times a day, muscle relaxer (Flexeril) 3 times a day, take Celebrex twice a day,  as needed for pain.  Apply warm compresses intermittently, as needed.  As pain recedes, begin normal activities slowly as tolerated.  Follow up with an orthopedic provider, if symptoms persist.  Watch for worsening symptoms such as an increasing weakness or loss of sensation, increasing pain and/or the loss of bladder or bowel function. Should any of these occur, go to the emergency department immediately.          ED Prescriptions     Medication Sig Dispense Auth. Provider   cyclobenzaprine (FLEXERIL) 5 MG tablet Take 1 tablet (5 mg total) by mouth 3 (three) times daily as needed for muscle spasms. 1 tablet every 8 hours as needed for muscle spasms 30 tablet Trejon Duford, DO   celecoxib (CELEBREX) 200 MG capsule Take 1 capsule (200 mg total) by mouth 2 (two) times daily. 60 capsule Mertice Uffelman, DO      PDMP not reviewed this encounter.   Katha Cabal, DO 06/12/23 1134

## 2023-06-12 NOTE — Discharge Instructions (Addendum)
On my review of your xray images, you did not have any fractures or dislocated bones. The radiologist has not yet read your xray. If it is significantly abnormal or urgent, someone will contact you.  You should see your results in MyChart.  If medication was prescribed, stop by the pharmacy to pick up your prescriptions.  For your  pain, Take 1000 mg Tylenol up to 3 times a day, muscle relaxer (Flexeril) 3 times a day, take Celebrex twice a day,  as needed for pain.  Apply warm compresses intermittently, as needed.  As pain recedes, begin normal activities slowly as tolerated.  Follow up with an orthopedic provider, if symptoms persist.  Watch for worsening symptoms such as an increasing weakness or loss of sensation, increasing pain and/or the loss of bladder or bowel function. Should any of these occur, go to the emergency department immediately.

## 2023-06-12 NOTE — ED Triage Notes (Addendum)
Lower right side back pain x 1 day. . No urinary difficulty. Hx of back pain. Was seen by primary yesterday who instructed him to take tylenol. Patient states that when he tries to stand up a sharp pain goes down his leg.

## 2023-12-09 ENCOUNTER — Encounter: Payer: Self-pay | Admitting: Emergency Medicine

## 2023-12-09 ENCOUNTER — Ambulatory Visit
Admission: EM | Admit: 2023-12-09 | Discharge: 2023-12-09 | Disposition: A | Discharge status: Home / Self Care | Attending: Family Medicine | Admitting: Family Medicine

## 2023-12-09 DIAGNOSIS — M545 Low back pain, unspecified: Secondary | ICD-10-CM | POA: Diagnosis not present

## 2023-12-09 DIAGNOSIS — G8929 Other chronic pain: Secondary | ICD-10-CM

## 2023-12-09 MED ORDER — DEXAMETHASONE SODIUM PHOSPHATE 10 MG/ML IJ SOLN
10.0000 mg | Freq: Once | INTRAMUSCULAR | Status: AC
  Administered 2023-12-09: 10 mg via INTRAMUSCULAR

## 2023-12-09 MED ORDER — PREDNISONE 10 MG (21) PO TBPK: ORAL_TABLET | Freq: Every day | ORAL | 0 refills | Status: AC

## 2023-12-09 MED ORDER — CYCLOBENZAPRINE HCL 10 MG PO TABS: 10.0000 mg | ORAL_TABLET | Freq: Three times a day (TID) | ORAL | 0 refills | Status: AC | PRN

## 2023-12-09 MED ORDER — NAPROXEN 500 MG PO TABS: 500.0000 mg | ORAL_TABLET | Freq: Two times a day (BID) | ORAL | 0 refills | Status: AC

## 2023-12-09 NOTE — Discharge Instructions (Signed)
 If medication was prescribed, stop by the pharmacy to pick up your prescriptions.  For your  pain, Take 1000 mg Tylenol  3-4 times a day, take muscle relaxer (Flexeril ) 2-3 times a day, take Naprosyn twice a day,  as needed for pain.  Apply moist heat or a heating pad intermittently, as needed.  Avoid heavy lifting. As pain recedes, begin normal activities slowly as tolerated.  Follow up with spine specialist, if symptoms persist.  Watch for worsening symptoms such as an increasing weakness or loss of sensation, increasing pain and/or the loss of bladder or bowel function. Should any of these occur, go to the emergency department immediately.

## 2023-12-09 NOTE — ED Provider Notes (Signed)
 [facility]    CSN: [id number] Arrival date & time: 12/09/23  1308      History   Chief Complaint Chief Complaint  Patient presents with   Back Pain    HPI  HPI Andrew Bolton is a 63 y.o. male.   History provided by patient and his fianc Andrew Bolton presents for left  low back pain.  He saw his spine specialist on Thursday. After leaving his office he started hurting really bad.  Andrew Bolton states he has been laying down in pain all weekend.  She had to apply lidocaine patches to 3 different spots on his back.  Taking 1000 mg tylenol  3 times a day without relief.  Took a dose of ibuprofen . Pain worse with lifting his right left and walking. He is not able to lay back without pain and needs help sitting up.  Pain feels better when he leans forward.Has some muscle relaxers but he has not been taking them.      Fever : no  Weight loss: no Perianal numbness: no Bowel incontinence: no Bladder incontinence: no Trauma: no  Hydration: normal  Abdominal pain: no Nausea: no Vomiting: no     Past Medical History:  Diagnosis Date   High cholesterol    Hypertension     Patient Active Problem List   Diagnosis Date Noted   CRVO (central retinal vein occlusion) 07/03/2022   Optic neuropathy 07/03/2022   Seasonal allergies 08/21/2021   Hx of chest pain 07/25/2021   Thrombosed external hemorrhoid 07/25/2021   Recurrent dry cough 05/21/2019   Weight loss 05/13/2019   Hypertension 02/19/2019   Sleeping difficulty 01/15/2019   Healthcare maintenance 09/19/2018   Fatty liver 07/11/2018   Chest mass 05/13/2018   Hyperlipidemia 05/13/2018   Chronic angle-closure glaucoma of both eyes, severe stage 01/02/2017   Pre-diabetes 09/03/2016   History of TIA (transient ischemic attack) 02/09/2016   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 Authorizing Provider  acetaminophen  (TYLENOL ) 500 MG tablet Take 1,000 mg by mouth. 10/10/22  Yes [provider]  allopurinol (ZYLOPRIM) 300 MG tablet Take 300 mg by mouth every morning.   Yes [provider]  ALPHAGAN P 0.1 % SOLN Place 1 drop into the left eye 2 (two) times daily.   Yes [provider]  aspirin 81 MG chewable tablet Chew by mouth. 11/07/21  Yes [provider]  atorvastatin (LIPITOR) 40 MG tablet SMARTSIG:1 Tablet(s) By Mouth Every Evening   Yes [provider]  bisacodyl (DULCOLAX) 5 MG EC tablet Take by mouth. 05/03/21  Yes [provider]  carbamide peroxide (DEBROX) 6.5 % OTIC solution 5 drops. 11/22/23 11/21/24 Yes [provider]  celecoxib  (CELEBREX ) 200 MG capsule Take 200 mg by mouth. 10/23/23  Yes [provider]  cetirizine (ZYRTEC) 10 MG tablet Take 1 tablet by mouth daily. 11/07/21 12/09/23 Yes [provider]  colchicine 0.6 MG tablet TAKE ONE TABLET (0.6 MG) BY MOUTH DAILY AT 9AM 06/27/22  Yes [provider]  cyclobenzaprine  (FLEXERIL ) 10 MG tablet Take 1 tablet (10 mg total) by mouth 3 (three) times daily as needed. 12/09/23  Yes Mumtaz Lovins, DO  cyclobenzaprine  (FLEXERIL ) 5 MG tablet Take 1 tablet (5 mg total) by mouth 3 (three) times daily as needed for muscle spasms. 1 tablet every 8 hours as needed for muscle spasms 06/12/23  Yes Maricruz Lucero, DO  diclofenac Sodium (VOLTAREN) 1 % GEL Apply topically. 11/07/21  Yes [provider]  dorzolamide-timolol (COSOPT) 2-0.5 % ophthalmic solution Administer 1 drop to both eyes Two (2) times a day. 03/13/22  Yes  [provider]  famotidine (PEPCID) 20 MG tablet Take by mouth. 11/07/21 12/09/23 Yes [provider]  lidocaine (LIDODERM) 5 % Place 1 patch onto the skin. 12/05/23  Yes [provider]  losartan (COZAAR) 25 MG tablet Take 1 tablet by mouth daily. 05/07/23 05/06/24 Yes [provider]  naproxen (NAPROSYN) 500 MG tablet Take 1 tablet (500 mg total) by mouth 2 (two) times daily with a meal. 12/09/23  Yes Eevee Borbon, DO  nitroGLYCERIN (NITROSTAT) 0.4 MG SL tablet SMARTSIG:1 Tablet(s) Sublingual PRN   Yes [provider]  omeprazole (PRILOSEC) 40 MG capsule Take 1 capsule by mouth daily. 11/22/23 12/26/24 Yes [provider]  predniSONE (STERAPRED UNI-PAK 21 TAB) 10 MG (21) TBPK tablet Take by mouth daily. Take 6 tabs by mouth daily for 1, then 5 tabs for 1 day, then 4 tabs for 1 day, then 3 tabs for 1 day, then 2 tabs for 1 day, then 1 tab for 1 day. 12/09/23  Yes Averleigh Savary, DO  traZODone (DESYREL) 50 MG tablet Take 50 mg by mouth. 10/23/23 10/17/24 Yes [provider]  fluticasone (FLONASE) 50 MCG/ACT nasal spray Place 1 spray into both nostrils daily.    [provider]  ketorolac  (ACULAR ) 0.5 % ophthalmic solution Place 1 drop into the right eye 3 (three) times daily.    [provider]  latanoprost (XALATAN) 0.005 % ophthalmic solution Place 1 drop into the right eye at bedtime. 03/13/22   [provider]  lisinopril (ZESTRIL) 5 MG tablet Take 5 mg by mouth every morning.    [provider]  oxyCODONE -acetaminophen  (ROXICET) 5-325 MG tablet Take 1 tablet by mouth every 4 (four) hours as needed for severe pain. 06/07/16   Sung, Jade J, MD  polyethylene glycol powder [facility]) 17 GM/SCOOP powder Take by mouth. 11/07/21   [provider]  tamsulosin (FLOMAX) 0.4 MG CAPS capsule Take 1 capsule by mouth daily. 11/07/21   [provider]  Travoprost, BAK Free, (TRAVATAN) 0.004 % SOLN  ophthalmic solution INSTILL ONE DROP IN [facility] EYE IN THE EVENING (BULK) 06/30/22   [provider]    Family History History reviewed. No pertinent family history.  Social History Social History   Tobacco Use   Smoking status: Never   Smokeless tobacco: Never  Vaping Use   Vaping status: Never Used  Substance Use Topics   Alcohol use: No   Drug use: Never     Allergies   Bee venom and Pollen extract   Review of Systems Review of Systems: egative unless otherwise stated in HPI.      Physical Exam Triage Vital Signs ED Triage Vitals  Encounter Vitals Group     BP 12/09/23 1014 118/81     Systolic BP Percentile --      Diastolic BP Percentile --      Pulse Rate 12/09/23 1014  84     Resp 12/09/23 1014 16     Temp 12/09/23 1014 98.2 F (36.8 C)     Temp Source 12/09/23 1014 Oral     SpO2 12/09/23 1014 98 %     Weight --      Height --      Head Circumference --      Peak Flow --      Pain Score 12/09/23 1011 9     Pain Loc --      Pain Education --      Exclude from Growth Chart --    No data found.  Updated Vital Signs BP 118/81 (BP Location: Left Arm)   Pulse 84   Temp 98.2 F (36.8 C) (Oral)   Resp 16   SpO2 98%   Visual Acuity Right Eye Distance:   Left Eye Distance:   Bilateral Distance:    Right Eye Near:   Left Eye Near:    Bilateral Near:     Physical Exam GEN: well appearing male in no acute distress  CVS: well perfused  RESP: speaking in full sentences without pause, no respiratory distress  MSK:  spine: - Inspection: no gross deformity or asymmetry, swelling or ecchymosis. No skin changes  - Palpation: TTP over the lumbar spinous processes, bilateral lumbar paraspinal muscle hypertonicity with tenderness to palpation on the left lower back - ROM: limited active ROM of the lumbar spine in flexion and extension but with mild pain  - Strength: 5/5 strength of lower extremity - Neuro: sensation intact in the L4-S1 nerve root  distribution b/l - Special testing: Negative straight leg raise SKIN: warm, dry, no overly skin rash or erythema    UC Treatments / Results  Labs (all labs ordered are listed, but only abnormal results are displayed) Labs Reviewed - No data to display  EKG   Radiology No results found.   Procedures Procedures (including critical care time)  Medications Ordered in UC Medications  dexamethasone (DECADRON) injection 10 mg (10 mg Intramuscular Given 12/09/23 1059)    Initial Impression / Assessment and Plan / UC Course  I have reviewed the triage vital signs and the nursing notes.  Pertinent labs & imaging results that were available during my care of the patient were reviewed by me and considered in my medical decision making (see chart for details).      Pt is a 63 y.o.  male with acute on chronic lower back pain. He is followed by neurosurgery for his pain.  On chart review, patient seen on 12/05/2023 at [facility] neurosurgery at the spine center.  Per the note by Reno Cash, PA patient was to take over-the-counter medications including salon patches.  He was referred to physical therapy for his chronic axial lower back pain without radicular symptoms.  X-ray in October 2024 that showed L3-L4 spondylosis.  They are considering an MRI or epidural steroid injections if he has no improvement after a few weeks of conservative treatment.  Imaging deferred today.  Patient has had some relief with steroids in the past.  Recommended steroid injection here and he is agreeable.  Decadron 10 mg IM given.  Patient to gradually return to normal activities, as tolerated and continue ordinary activities within the limits permitted by pain. Prescribed prednisone taper, naproxen sodium  and muscle relaxer  for pain relief.  Advised patient to avoid other NSAIDs while taking Naprosyn.  He will hold his home Celebrex  as this is not helping.  Most recent serum creatinine was normal.  Take prednisone and  Naprosyn with food to prevent acid reflux and gastric irritation.  Tylenol  and Lidocaine patches PRN for multimodal pain relief. Counseled patient on red flag symptoms and when to seek immediate care.  No red flags suggesting cauda equina syndrome or progressive major motor weakness. Patient to follow up with spine specialist for ongoing pain.  He may benefit from an MRI for possible spinal stenosis.  ED precautions given.   Discussed MDM, treatment plan and plan for follow-up with patient who agrees with plan.   Final Clinical Impressions(s) / UC Diagnoses   Final diagnoses:  Chronic midline low back pain without sciatica     Discharge Instructions      If medication was prescribed, stop by the pharmacy to pick up your prescriptions.  For your  pain, Take 1000 mg Tylenol  3-4 times a day, take muscle relaxer (Flexeril ) 2-3 times a day, take Naprosyn twice a day,  as needed for pain.  Apply moist heat or a heating pad intermittently, as needed.  Avoid heavy lifting. As pain recedes, begin normal activities slowly as tolerated.  Follow up with spine specialist, if symptoms persist.  Watch for worsening symptoms such as an increasing weakness or loss of sensation, increasing pain and/or the loss of bladder or bowel function. Should any of these occur, go to the emergency department immediately.        ED Prescriptions     Medication Sig Dispense Auth. Provider   cyclobenzaprine  (FLEXERIL ) 10 MG tablet Take 1 tablet (10 mg total) by mouth 3 (three) times daily as needed. 30 tablet Drae Mitzel, DO   predniSONE (STERAPRED UNI-PAK 21 TAB) 10 MG (21) TBPK tablet Take by mouth daily. Take 6 tabs by mouth daily for 1, then 5 tabs for 1 day, then 4 tabs for 1 day, then 3 tabs for 1 day, then 2 tabs for 1 day, then 1 tab for 1 day. 21 tablet Jafet Wissing, DO   naproxen (NAPROSYN) 500 MG tablet Take 1 tablet (500 mg total) by mouth 2 (two) times daily with a meal. 30 tablet Ayman Brull, DO       I have reviewed the PDMP during this encounter.   Fatisha Rabalais, DO 12/09/23 1549

## 2023-12-09 NOTE — ED Triage Notes (Signed)
 Pt has a history of lower back pain and was seen at the spine clinic on 12/05/23. After the physician started to touch his back he developed severe pain that has not gone away. He has tried OTC pain medication with no relief.
